# Patient Record
Sex: Male | Born: 1935 | Race: White | Hispanic: No | Marital: Married
Health system: Southern US, Community
[De-identification: ages and names within clinical notes are randomized; demographics above are authoritative.]

---

## 2010-07-12 ENCOUNTER — Inpatient Hospital Stay: Payer: Self-pay | Admitting: Internal Medicine

## 2010-07-30 ENCOUNTER — Ambulatory Visit: Payer: Self-pay | Admitting: Cardiovascular Disease

## 2010-08-06 ENCOUNTER — Ambulatory Visit: Payer: Self-pay | Admitting: Vascular Surgery

## 2010-08-11 ENCOUNTER — Inpatient Hospital Stay: Payer: Self-pay | Admitting: Vascular Surgery

## 2011-07-23 ENCOUNTER — Ambulatory Visit: Payer: Self-pay | Admitting: Family Medicine

## 2011-10-20 ENCOUNTER — Ambulatory Visit: Payer: Self-pay | Admitting: Vascular Surgery

## 2011-10-20 LAB — CREATININE, SERUM
EGFR (African American): >60
EGFR (Non-African Amer.): >60

## 2011-11-25 ENCOUNTER — Ambulatory Visit: Payer: Self-pay | Admitting: Vascular Surgery

## 2011-11-25 LAB — CBC
HCT: 42.7 % (ref 40.0–52.0)
HGB: 14.4 g/dL (ref 13.0–18.0)
MCH: 33.2 pg (ref 26.0–34.0)
MCHC: 33.8 g/dL (ref 32.0–36.0)
MCV: 98 fL (ref 80–100)
Platelet: 165 10*3/uL (ref 150–440)

## 2011-11-25 LAB — BASIC METABOLIC PANEL
Anion Gap: 10 (ref 7–16)
Chloride: 104 mmol/L (ref 98–107)
Co2: 27 mmol/L (ref 21–32)
Creatinine: 0.97 mg/dL (ref 0.60–1.30)
EGFR (African American): >60
EGFR (Non-African Amer.): >60
Glucose: 87 mg/dL (ref 65–99)
Osmolality: 281 (ref 275–301)
Potassium: 4.5 mmol/L (ref 3.5–5.1)
Sodium: 141 mmol/L (ref 136–145)

## 2011-12-03 ENCOUNTER — Inpatient Hospital Stay: Payer: Self-pay | Admitting: Vascular Surgery

## 2011-12-04 LAB — APTT: Activated PTT: 25.9 secs (ref 23.6–35.9)

## 2011-12-04 LAB — BASIC METABOLIC PANEL
Anion Gap: 10 (ref 7–16)
BUN: 10 mg/dL (ref 7–18)
Calcium, Total: 8.3 mg/dL — ABNORMAL LOW (ref 8.5–10.1)
Chloride: 105 mmol/L (ref 98–107)
Co2: 26 mmol/L (ref 21–32)
Creatinine: 0.83 mg/dL (ref 0.60–1.30)
EGFR (African American): >60
EGFR (Non-African Amer.): >60
Glucose: 112 mg/dL — ABNORMAL HIGH (ref 65–99)
Osmolality: 281 (ref 275–301)
Potassium: 3.8 mmol/L (ref 3.5–5.1)
Sodium: 141 mmol/L (ref 136–145)

## 2011-12-04 LAB — CBC WITH DIFFERENTIAL/PLATELET
Basophil %: 0.2 %
Eosinophil #: 0.1 10*3/uL (ref 0.0–0.7)
HCT: 37.1 % — ABNORMAL LOW (ref 40.0–52.0)
HGB: 12.5 g/dL — ABNORMAL LOW (ref 13.0–18.0)
Lymphocyte #: 1.2 10*3/uL (ref 1.0–3.6)
MCH: 32.8 pg (ref 26.0–34.0)
MCHC: 33.8 g/dL (ref 32.0–36.0)
MCV: 97 fL (ref 80–100)
Neutrophil #: 6.4 10*3/uL (ref 1.4–6.5)
Neutrophil %: 75.3 %
Platelet: 153 10*3/uL (ref 150–440)
RDW: 14.3 % (ref 11.5–14.5)
WBC: 8.5 10*3/uL (ref 3.8–10.6)

## 2011-12-04 LAB — PROTIME-INR
INR: 1.1
Prothrombin Time: 14.3 secs (ref 11.5–14.7)

## 2011-12-06 LAB — PATHOLOGY REPORT

## 2012-11-15 ENCOUNTER — Emergency Department: Payer: Self-pay | Admitting: Emergency Medicine

## 2012-11-15 LAB — URINALYSIS, COMPLETE
Bilirubin,UR: NEGATIVE
Glucose,UR: NEGATIVE mg/dL (ref 0–75)
Ketone: NEGATIVE
Leukocyte Esterase: NEGATIVE
Nitrite: NEGATIVE
Ph: 5 (ref 4.5–8.0)
Protein: 100
RBC,UR: 294 /HPF (ref 0–5)
Specific Gravity: 1.013 (ref 1.003–1.030)
Squamous Epithelial: NONE SEEN
WBC UR: 22 /HPF (ref 0–5)

## 2012-11-15 LAB — PRO B NATRIURETIC PEPTIDE: B-Type Natriuretic Peptide: 13898 pg/mL — ABNORMAL HIGH (ref 0–125)

## 2012-11-15 LAB — CK TOTAL AND CKMB (NOT AT ARMC)
CK, Total: 231 U/L (ref 35–232)
CK-MB: 5.4 ng/mL — ABNORMAL HIGH (ref 0.5–3.6)

## 2012-11-15 LAB — BASIC METABOLIC PANEL
Anion Gap: 20 — ABNORMAL HIGH (ref 7–16)
BUN: 34 mg/dL — ABNORMAL HIGH (ref 7–18)
Chloride: 89 mmol/L — ABNORMAL LOW (ref 98–107)
Co2: 15 mmol/L — ABNORMAL LOW (ref 21–32)
Creatinine: 2.09 mg/dL — ABNORMAL HIGH (ref 0.60–1.30)
EGFR (African American): 35 — ABNORMAL LOW
Glucose: 140 mg/dL — ABNORMAL HIGH (ref 65–99)
Osmolality: 260 (ref 275–301)
Potassium: 5.6 mmol/L — ABNORMAL HIGH (ref 3.5–5.1)
Sodium: 124 mmol/L — ABNORMAL LOW (ref 136–145)

## 2012-11-15 LAB — CBC
HCT: 41.4 % (ref 40.0–52.0)
MCHC: 30.7 g/dL — ABNORMAL LOW (ref 32.0–36.0)
Platelet: 156 10*3/uL (ref 150–440)
RBC: 4.4 10*6/uL (ref 4.40–5.90)
WBC: 11.2 10*3/uL — ABNORMAL HIGH (ref 3.8–10.6)

## 2012-11-15 LAB — TROPONIN I: Troponin-I: 0.2 ng/mL — ABNORMAL HIGH

## 2012-11-28 DEATH — deceased

## 2013-06-11 IMAGING — CT CT ANGIO NECK-CHEST
2 series · 10 of 14 positions shown · IV contrast (APPLIED)
Comparison: none

REASON FOR EXAM: carotid stenosis subclavian stal  to include ARCH eval
rt innominate stenosis
COMMENTS:

PROCEDURE:     KCT - KCT ANGIOGRAPHY CHEST/NECK W/WO  - October 20, 2011  [DATE]
RESULT:     Axial source images as well as coronal and sagittal maximum
intensity reconstructions as well as 3-D reconstruction were obtained of the
right and left carotid systems.

[Series 4: soft tissue chest · axial · 0.74mm/px · z∈[+549,+609]mm · 2 of 60 slices shown]
[im 20/60  soft-tissue]
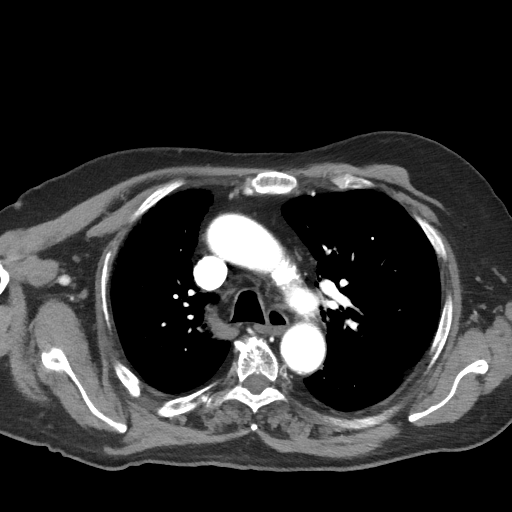
[im 40/60  soft-tissue]
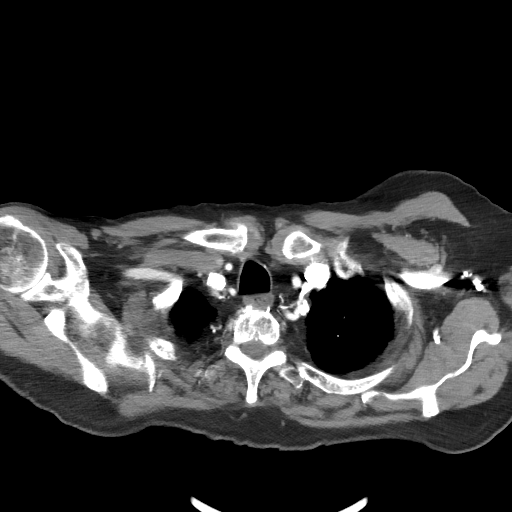

[Series 5: soft tissue neck · axial · 0.45mm/px · z∈[+546,+840]mm · 8 of 128 slices shown]
[im 15/128  soft-tissue]
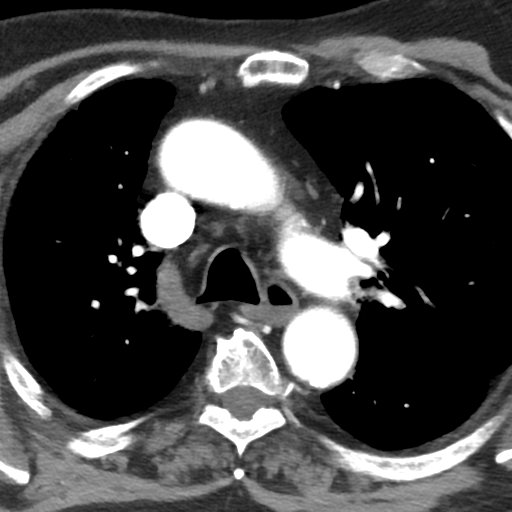
[im 29/128  bone]
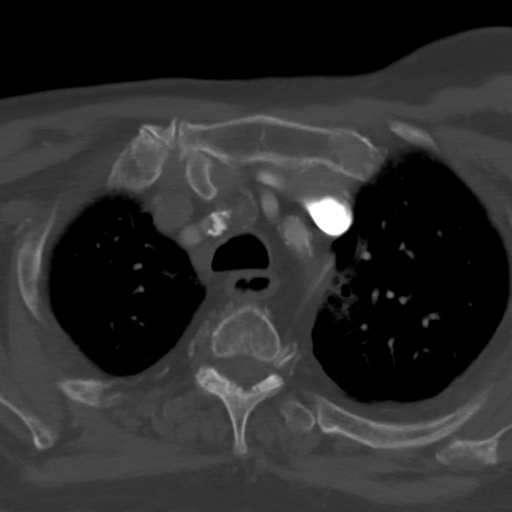
[im 43/128  soft-tissue]
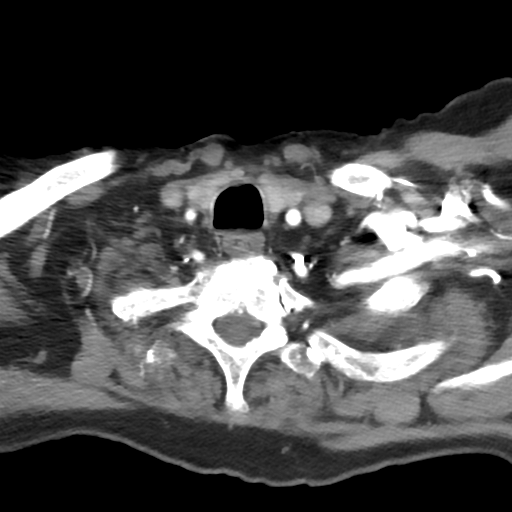
[im 57/128  bone]
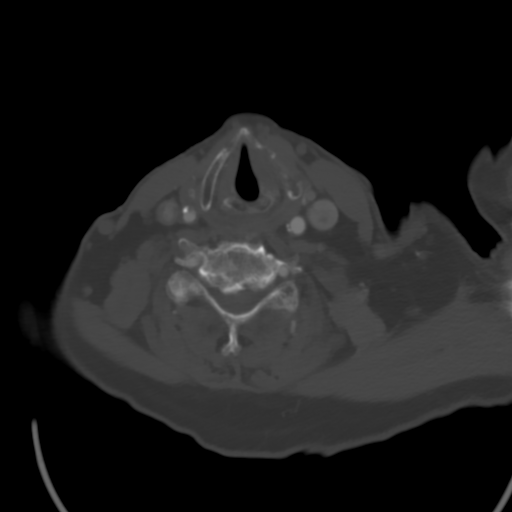
[im 71/128  soft-tissue]
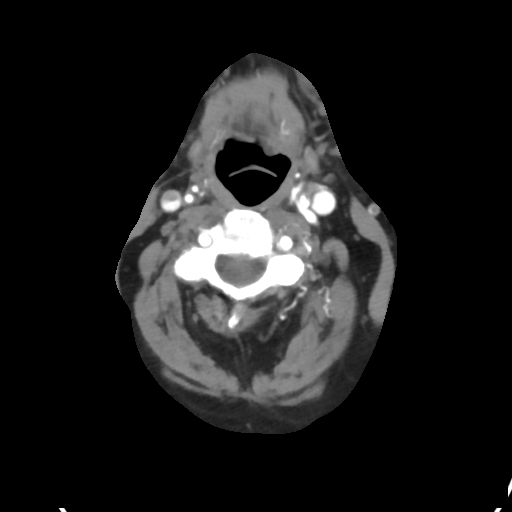
[im 85/128  bone]
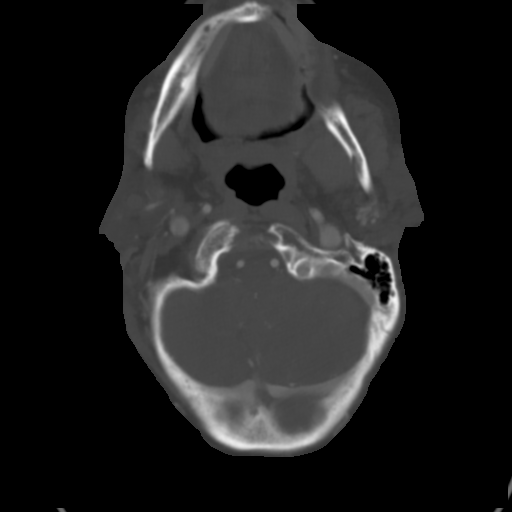
[im 99/128  soft-tissue]
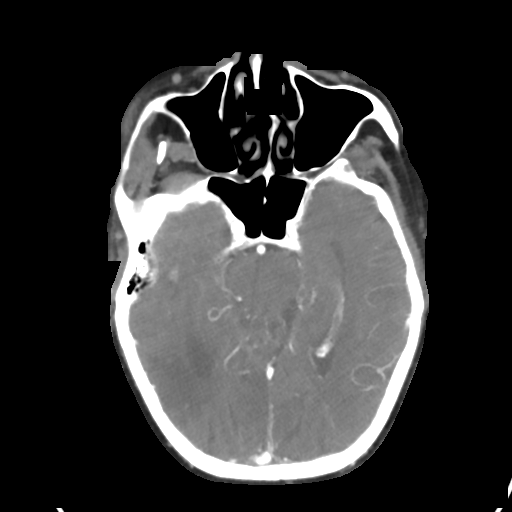
[im 113/128  bone]
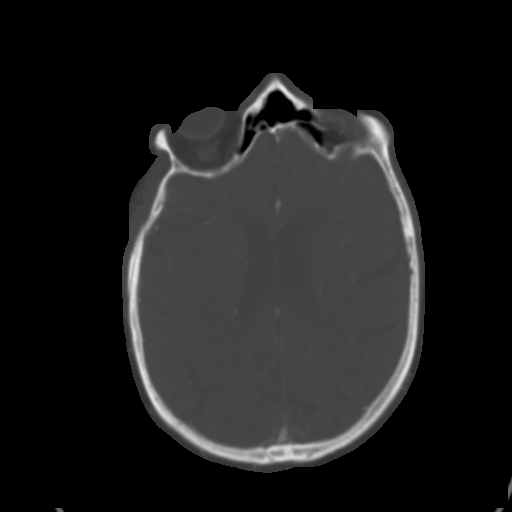

[10 of 14 positions shown; findings below may reference images not displayed]

FINDINGS: Evaluation of the right carotid system demonstrates coarse mural
calcifications involving the common carotid artery as well as the carotid
bulb region.

At the origin of the common carotid artery, there are findings concerning
for moderate to high-grade stenosis possibly on the magnitude to 60 to 65%.
A second area concerning for mural calcification extends along the distal
aspect of the common carotid artery. This contains noncalcified and
calcified mural thrombus with findings concerning for stenosis of again 60
to 65%. The internal carotid artery on the right demonstrates no evidence of
stenosis or focal outpouchings. The external carotid appears patent.

Evaluation of the left system demonstrates coarse mural calcifications at
the origin of the internal carotid artery. This area appears to demonstrate
mild narrowing possibly of the magnitude of 25 at 30%. A region concerning
for high-grade narrowing is identified within the carotid bulb on the left.
This area is concerning for a region of stenosis of 75 to 85%. The area is
partially obscured by coarse mural calcifications. The internal and common
carotid arteries on the left demonstrate no evidence of strictural
narrowing, fusiform dilatation or focal outpouchings. Moderately coarse
mural calcification are identified within the proximal aspect of common
carotid artery on the left.
IMPRESSION: 1.     Findings consistent with high-grade hemodynamically significant
stenosis within the carotid bulb on the left.
2.     There are areas within the right carotid bulb and at the origin of
the right common carotid artery which demonstrate findings concerning for
hemodynamically significant stenosis.

## 2014-07-08 IMAGING — CR DG CHEST 1V PORT
1 series · 1 of 1 positions shown · non-contrast
Comparison: none

REASON FOR EXAM: s/p central line placement
COMMENTS:

PROCEDURE:     DXR - DXR PORTABLE CHEST SINGLE VIEW  - November 15, 2012  [DATE]
RESULT:     Comparison: Earlier same day.

[ap]
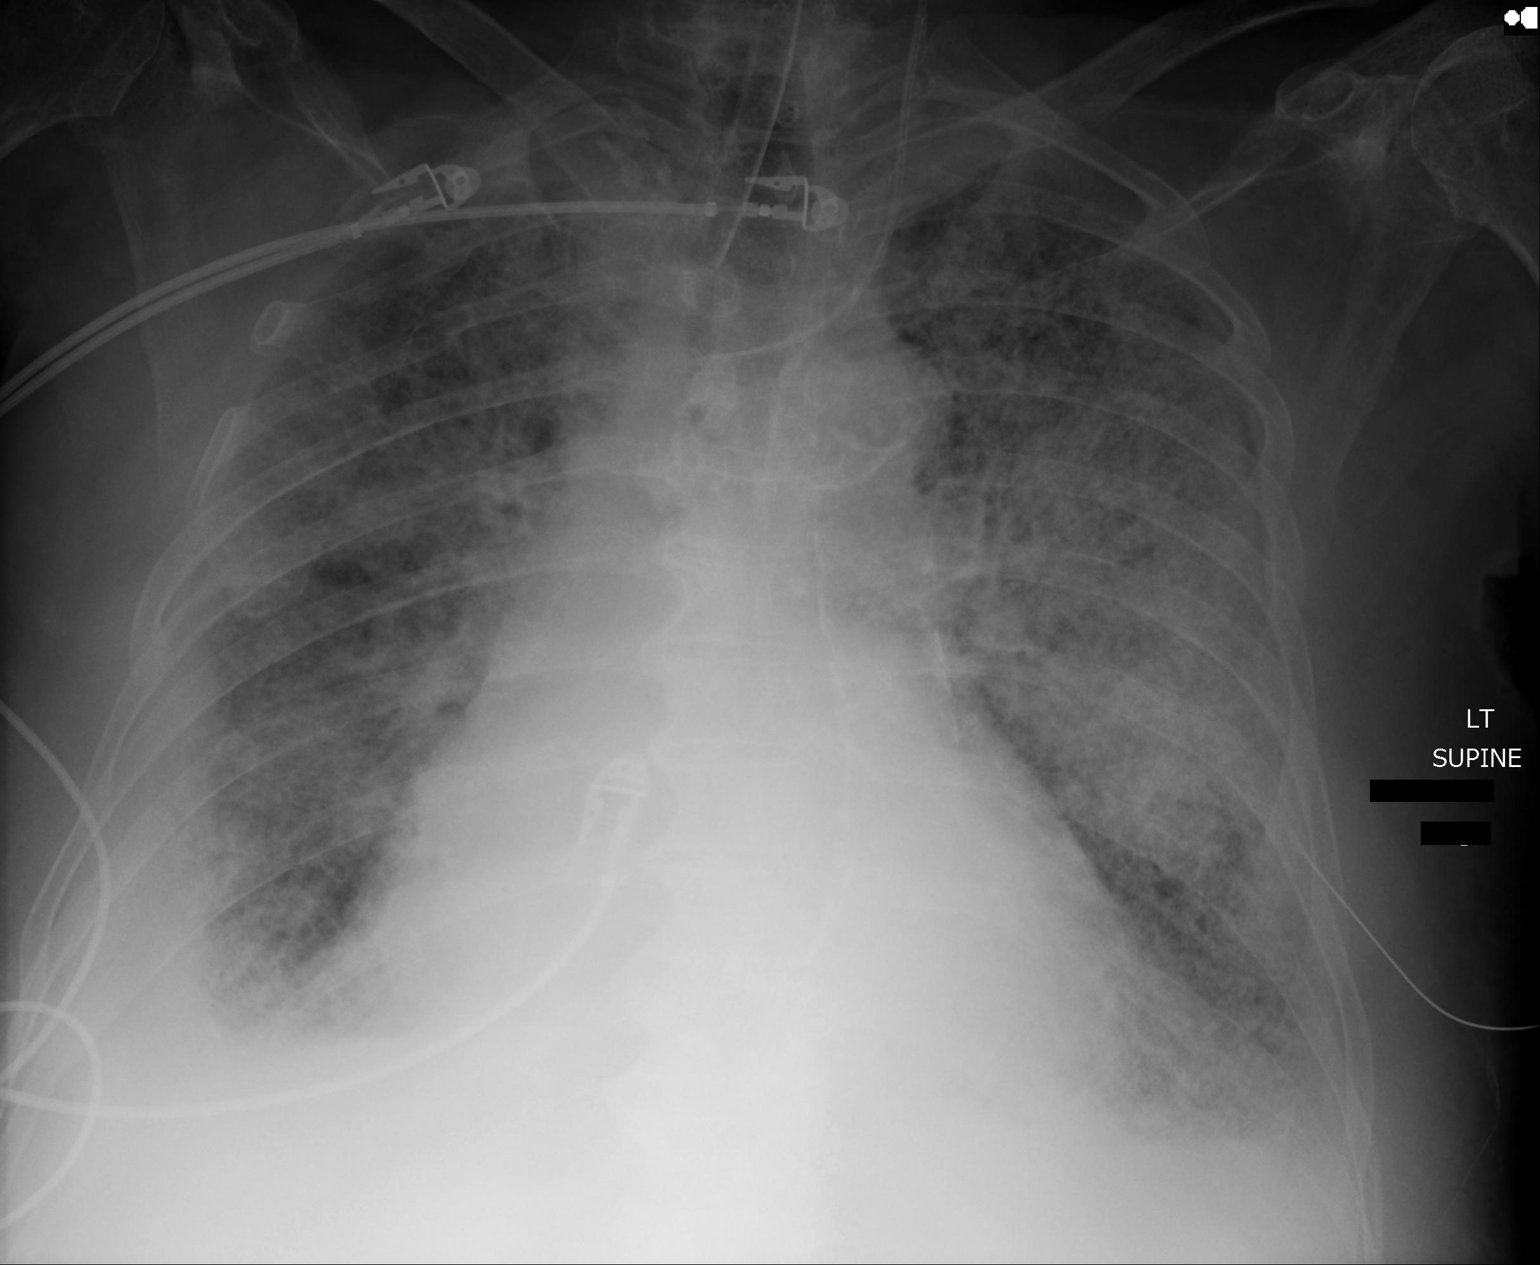

[1 of 1 positions shown; findings below may reference images not displayed]

FINDINGS: Left IJ central venous catheter tip terminates over the left brachiocephalic
vein. No pneumothorax seen. Endotracheal tube terminates at or just below
the thoracic inlet. Cardiomegaly and the mediastinum are similar to prior.
Diffuse heterogeneous pulmonary opacities are likely secondary to pulmonary
edema, similar to prior. The small right pleural effusion. Possible small
left pleural effusion.
IMPRESSION: 1. Left IJ central venous catheter tip terminates over the left
brachycephalic vein.
2. Diffuse pulmonary opacities likely secondary to pulmonary edema, similar
to prior. Infection is not excluded.

## 2014-07-08 IMAGING — CR DG CHEST 1V PORT
1 series · 1 of 1 positions shown · non-contrast
Comparison: none

REASON FOR EXAM: Post Entubation
COMMENTS:

PROCEDURE:     DXR - DXR PORTABLE CHEST SINGLE VIEW  - November 15, 2012  [DATE]
RESULT:     Comparison: 08/06/2010

[portable]
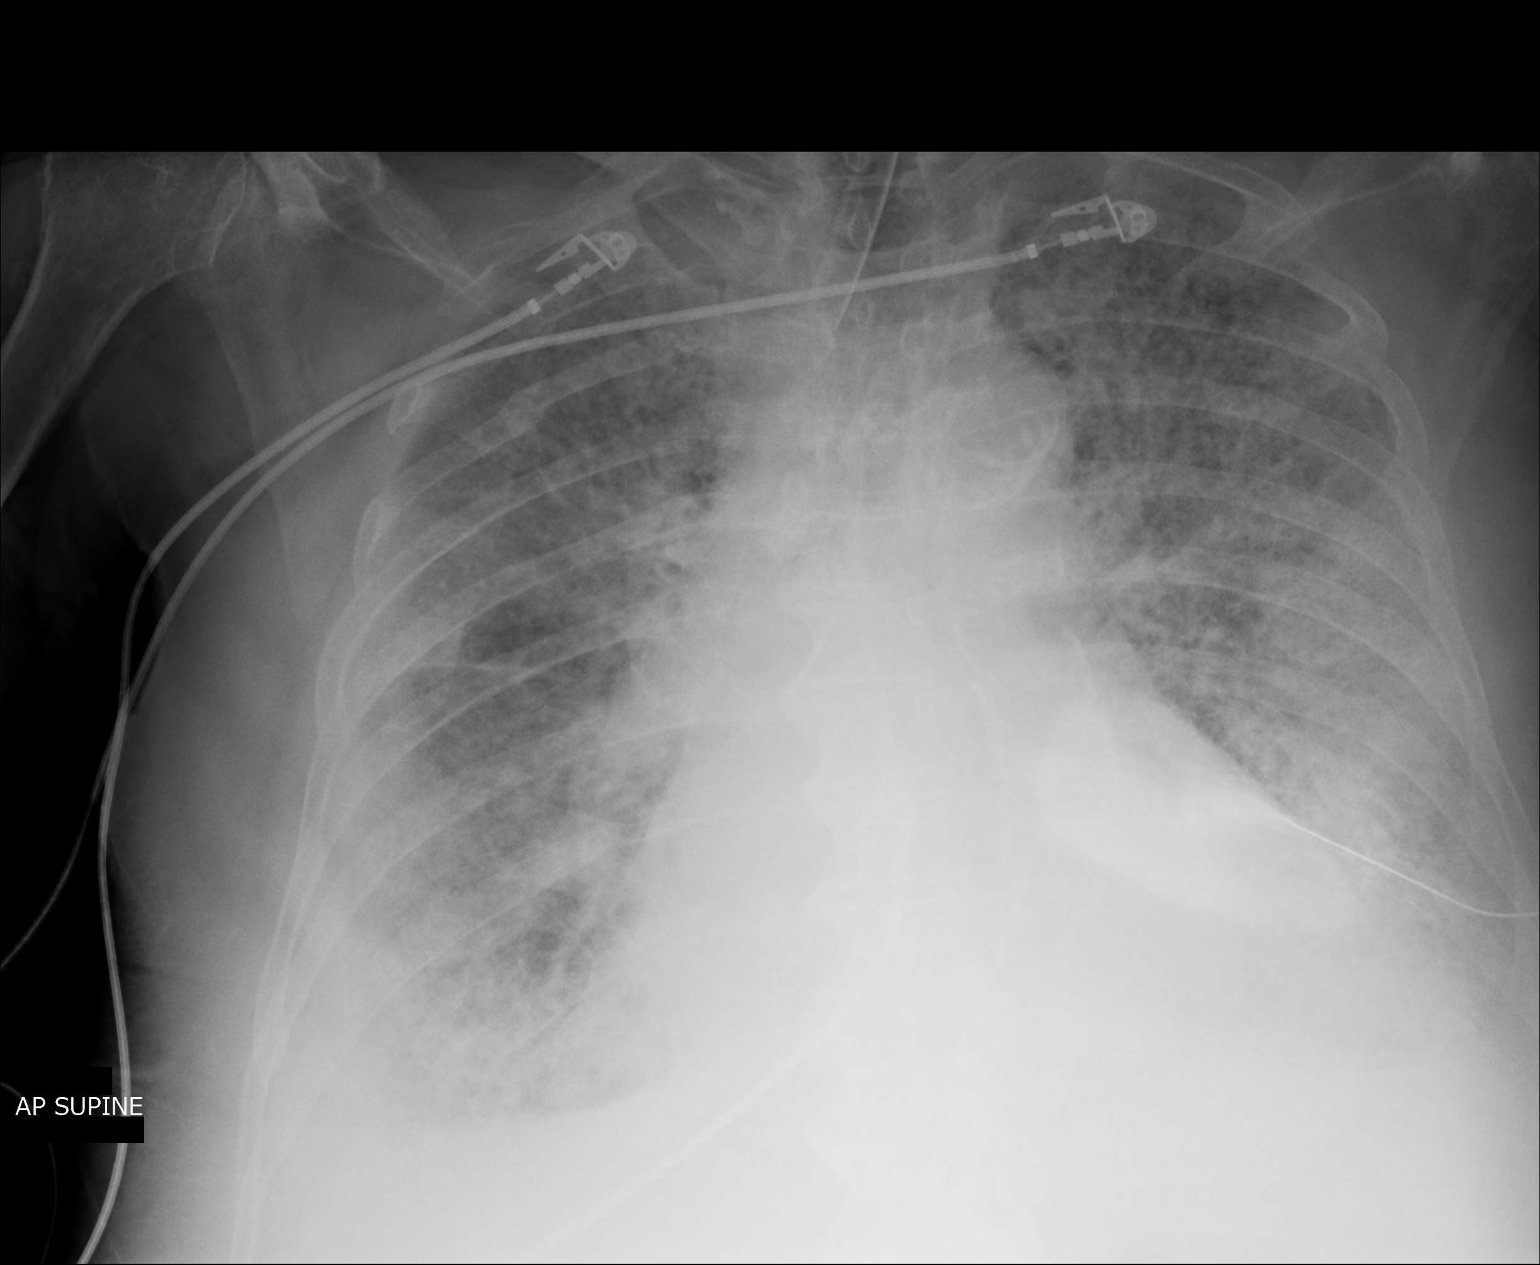

[1 of 1 positions shown; findings below may reference images not displayed]

FINDINGS: The endotracheal tube terminates in the superior thoracic trachea.
Cardiomegaly and the mediastinum are similar to prior. There are diffuse
heterogeneous opacities throughout the lungs. Likely small bilateral pleural
effusions.
IMPRESSION: Diffuse heterogeneous pulmonary opacities. Differential includes pulmonary
edema, infection, and ARDS.

## 2014-12-20 NOTE — Consult Note (Signed)
PATIENT NAME:  Brian Molina, Brian Molina MR#:  [id number] DATE OF BIRTH:  07-29-1936  DATE OF CONSULTATION:  08/17/13  REFERRING PHYSICIAN:  Lucrezia EuropeAllison Webster, MD CONSULTING PHYSICIAN:  Ramonita LabAruna Bentlie Withem, MD  PRIMARY CARE PHYSICIAN:  Barry BrunnerGlenn Willett, MD  CHIEF COMPLAINT: Altered mental status, unresponsiveness.   HISTORY OF PRESENT ILLNESS:  The patient is a 79 year old male with multiple medical problems who was seen by his cardiologist yesterday and he was given a few pills for fluid overload yesterday. Following that he went home and he fell. Family called EMS, and the patient refused to come to the ER, and he stated that " if it is time for him to go, he wants to go". Following that, at around 1:30 a.m. today, the patient became unresponsive. Family members started doing CPR and called EMS. EMS immediately gave him 3 epinephrine, intubated him, and brought him into the ER. The patient was coded multiple times in the ER and he has received epinephrine several times.  He was intubated, supported on ventilator, in AV mode, and  supported with IV fluids, dopamine drip, and Levophed drip. The patient was still hypotensive and subsequently family members made him DNR.  I was called to admit the patient, but at the time with the patient still being hypotensive, the patient's  legal medical POA, his wife and daughter, unanimously made a decision to withdrawal his life support, and family members refused the admission. Therefore, this patient was handed over to ER physician, Dr. Zenda AlpersWebster, for withdrawal of life support.  Off note, immediately after withdrawing support, the patient expired within 5 minutes and he was pronounced by Dr. Zenda AlpersWebster. ____________________________ Ramonita LabAruna Arma Reining, MD ag:sb D: 08/17/13 06:58:55 ET T: 08/17/13 07:08:33 ET JOB#: [id number]  cc: Ramonita LabAruna Jeovanni Heuring, MD, <Dictator> Ramonita LabARUNA Atilano Covelli MD ELECTRONICALLY SIGNED 11/16/2012 3:17

## 2014-12-22 NOTE — Op Note (Signed)
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   PROCEDURES PERFORMED:  1. Left carotid endarterectomy with CorMatrix patch angioplasty.  2. Repair of arterial defect with xenograft CorMatrix patch.   SURGEON: Levora DredgeGregory Elgin Carn, MD   FIRST ASSISTANT: Philomena CourseJason Kaylor, PA-C   ANESTHESIA: General by endotracheal intubation.   FLUIDS: Per anesthesia record.   ESTIMATED BLOOD LOSS: 100 mL.   SPECIMEN: Plaque to pathology for permanent section.   INDICATIONS: Brian Molina is a 79 year old gentleman who presents to the office with worsening carotid stenosis. A CT angiography confirmed greater than 90% stenosis, and he is undergoing carotid endarterectomy. The risks and benefits were reviewed. All questions are answered. The patient agrees to proceed.   DESCRIPTION OF PROCEDURE: The patient is taken to the Operating Room and placed in the supine position. After adequate general anesthesia is induced, appropriate invasive monitors are placed, he is positioned with his neck extended and rotated to the right. The left neck and chest wall are prepped and draped in sterile fashion. A curvilinear incision is created along the anterior margin of the sternocleidomastoid muscle and carried down through the soft tissues. Platysma is transected with Bovie cautery. The sternocleidomastoid muscle and omohyoid muscle are then identified, and the dissection is carried deeply at the level of the omohyoid to identify the common carotid artery. Dissection is then carried craniad along the anterior margin of the carotid artery, ligating the facial vein with 2-0 silk ties. Other venous  tributaries are ligated with 2-0 or 3-0 as needed. Circumferential dissection is then carried out around the internal carotid artery as well as the common carotid artery and the external carotid artery and the superior thyroidal. Vessel loops were placed around the external carotid artery and the superior thyroidal, and 7000 units of heparin is given and allowed to circulate for four minutes.   The common carotid, followed by the external, followed by the internal carotid artery is clamped. Arteriotomy is made, extended with Potts scissors, and an indwelling Sundt shunt is placed without difficulty. CorMatrix patch is opened onto the field and rehydrated on the back table. Endarterectomy is then performed of the common bulb and internal carotid artery under direct visualization. The external carotid artery is treated with eversion technique. The 7-0 Prolene interrupted tacking sutures are used to secure the distal intimal edge within the internal carotid artery, and 6-0 Prolene is used proximally. The CorMatrix patch is then trimmed and applied to the arterial defect using running 6-0 Prolene in a four-quadrant technique. The Sundt shunt is removed. Flushing maneuvers are performed, and the suture line is completed. Flow is then re-established first to the external carotid artery and then the internal carotid artery to prevent distal embolization.   The wound is then inspected for hemostasis, irrigated and then closed using 3-0 Vicryl in a running fashion for the platysma and 4-0 Monocryl subcuticular for the skin. Dermabond is applied. The patient tolerated the procedure well.   The patient is waking in the Operating Room, moving all extremities and obeying simple commands. He is taken to the recovery area in stable condition.    ____________________________ Renford DillsGregory G. Suleika Donavan, MD ggs:cbb D: 12/03/2011 10:06:38 ET T: 12/03/2011 10:38:54 ET JOB#: [id number]  cc:  Renford Dills, MD, <Dictator> Jorje Guild. Beckey Downing, MD Renford Dills MD ELECTRONICALLY SIGNED 12/08/2011 14:08
